# Patient Record
Sex: Male | Born: 1975 | Race: White | Hispanic: No | Marital: Single | Smoking: Former smoker
Health system: Southern US, Community
[De-identification: ages and names within clinical notes are randomized; demographics above are authoritative.]

## PROBLEM LIST (undated history)

## (undated) HISTORY — PX: ELBOW SURGERY: SHX618

## (undated) HISTORY — PX: BACK SURGERY: SHX140

---

## 2015-02-25 ENCOUNTER — Emergency Department
Admission: EM | Admit: 2015-02-25 | Discharge: 2015-02-25 | Disposition: A | Discharge status: Home / Self Care | Payer: BLUE CROSS/BLUE SHIELD | Attending: Emergency Medicine | Admitting: Emergency Medicine

## 2015-02-25 DIAGNOSIS — M5432 Sciatica, left side: Secondary | ICD-10-CM | POA: Diagnosis not present

## 2015-02-25 DIAGNOSIS — M62830 Muscle spasm of back: Secondary | ICD-10-CM | POA: Diagnosis not present

## 2015-02-25 DIAGNOSIS — M545 Low back pain: Secondary | ICD-10-CM | POA: Diagnosis present

## 2015-02-25 MED ORDER — MELOXICAM 15 MG PO TABS: 15.0000 mg | ORAL_TABLET | Freq: Every day | ORAL | Status: DC

## 2015-02-25 MED ORDER — DIAZEPAM 2 MG PO TABS: 2.0000 mg | ORAL_TABLET | Freq: Three times a day (TID) | ORAL | Status: AC | PRN

## 2015-02-25 NOTE — Discharge Instructions (Signed)
Back Exercises The following exercises strengthen the muscles that help to support the back. They also help to keep the lower back flexible. Doing these exercises can help to prevent back pain or lessen existing pain. If you have back pain or discomfort, try doing these exercises 2-3 times each day or as told by your health care provider. When the pain goes away, do them once each day, but increase the number of times that you repeat the steps for each exercise (do more repetitions). If you do not have back pain or discomfort, do these exercises once each day or as told by your health care provider. EXERCISES Single Knee to Chest Repeat these steps 3-5 times for each leg:  Lie on your back on a firm bed or the floor with your legs extended.  Bring one knee to your chest. Your other leg should stay extended and in contact with the floor.  Hold your knee in place by grabbing your knee or thigh.  Pull on your knee until you feel a gentle stretch in your lower back.  Hold the stretch for 10-30 seconds.  Slowly release and straighten your leg. Pelvic Tilt Repeat these steps 5-10 times:  Lie on your back on a firm bed or the floor with your legs extended.  Bend your knees so they are pointing toward the ceiling and your feet are flat on the floor.  Tighten your lower abdominal muscles to press your lower back against the floor. This motion will tilt your pelvis so your tailbone points up toward the ceiling instead of pointing to your feet or the floor.  With gentle tension and even breathing, hold this position for 5-10 seconds. Cat-Cow Repeat these steps until your lower back becomes more flexible:  Get into a hands-and-knees position on a firm surface. Keep your hands under your shoulders, and keep your knees under your hips. You may place padding under your knees for comfort.  Let your head hang down, and point your tailbone toward the floor so your lower back becomes rounded like the  back of a cat.  Hold this position for 5 seconds.  Slowly lift your head and point your tailbone up toward the ceiling so your back forms a sagging arch like the back of a cow.  Hold this position for 5 seconds. Press-Ups Repeat these steps 5-10 times:  Lie on your abdomen (face-down) on the floor.  Place your palms near your head, about shoulder-width apart.  While you keep your back as relaxed as possible and keep your hips on the floor, slowly straighten your arms to raise the top half of your body and lift your shoulders. Do not use your back muscles to raise your upper torso. You may adjust the placement of your hands to make yourself more comfortable.  Hold this position for 5 seconds while you keep your back relaxed.  Slowly return to lying flat on the floor. Bridges Repeat these steps 10 times:  Lie on your back on a firm surface.  Bend your knees so they are pointing toward the ceiling and your feet are flat on the floor.  Tighten your buttocks muscles and lift your buttocks off of the floor until your waist is at almost the same height as your knees. You should feel the muscles working in your buttocks and the back of your thighs. If you do not feel these muscles, slide your feet 1-2 inches farther away from your buttocks.  Hold this position for 3-5  seconds.  Slowly lower your hips to the starting position, and allow your buttocks muscles to relax completely. If this exercise is too easy, try doing it with your arms crossed over your chest. Abdominal Crunches Repeat these steps 5-10 times:  Lie on your back on a firm bed or the floor with your legs extended.  Bend your knees so they are pointing toward the ceiling and your feet are flat on the floor.  Cross your arms over your chest.  Tip your chin slightly toward your chest without bending your neck.  Tighten your abdominal muscles and slowly raise your trunk (torso) high enough to lift your shoulder blades a  tiny bit off of the floor. Avoid raising your torso higher than that, because it can put too much stress on your low back and it does not help to strengthen your abdominal muscles.  Slowly return to your starting position. Back Lifts Repeat these steps 5-10 times: 1. Lie on your abdomen (face-down) with your arms at your sides, and rest your forehead on the floor. 2. Tighten the muscles in your legs and your buttocks. 3. Slowly lift your chest off of the floor while you keep your hips pressed to the floor. Keep the back of your head in line with the curve in your back. Your eyes should be looking at the floor. 4. Hold this position for 3-5 seconds. 5. Slowly return to your starting position. SEEK MEDICAL CARE IF:  Your back pain or discomfort gets much worse when you do an exercise.  Your back pain or discomfort does not lessen within 2 hours after you exercise. If you have any of these problems, stop doing these exercises right away. Do not do them again unless your health care provider says that you can. SEEK IMMEDIATE MEDICAL CARE IF:  You develop sudden, severe back pain. If this happens, stop doing the exercises right away. Do not do them again unless your health care provider says that you can.   This information is not intended to replace advice given to you by your health care provider. Make sure you discuss any questions you have with your health care provider.   Document Released: 04/28/2004 Document Revised: 12/10/2014 Document Reviewed: 05/15/2014 Elsevier Interactive Patient Education 2016 [place] Injury Prevention Back injuries can be very painful. They can also be difficult to heal. After having one back injury, you are more likely to injure your back again. It is important to learn how to avoid injuring or re-injuring your back. The following tips can help you to prevent a back injury. WHAT SHOULD I KNOW ABOUT PHYSICAL FITNESS?  Exercise for 30 minutes per  day on most days of the week or as directed by your health care provider. Make sure to:  Do aerobic exercises, such as walking, jogging, biking, or swimming.  Do exercises that increase balance and strength, such as tai chi and yoga. These can decrease your risk of falling and injuring your back.  Do stretching exercises to help with flexibility.  Try to develop strong abdominal muscles. Your abdominal muscles provide a lot of the support that is needed by your back.  Maintain a healthy weight. This helps to decrease your risk of a back injury. WHAT SHOULD I KNOW ABOUT MY DIET?  Talk with your health care provider about your overall diet. Take supplements and vitamins only as directed by your health care provider.  Talk with your health care provider about how much calcium and  D you need each day. These nutrients help to prevent weakening of the bones (osteoporosis). Osteoporosis can cause broken (fractured) bones, which lead to back pain.  Include good sources of calcium in your diet, such as dairy products, green leafy vegetables, and products that have had calcium added to them (fortified).  Include good sources of vitamin D in your diet, such as milk and foods that are fortified with vitamin D. WHAT SHOULD I KNOW ABOUT MY POSTURE?  Sit up straight and stand up straight. Avoid leaning forward when you sit or hunching over when you stand.  Choose chairs that have good low-back (lumbar) support.  If you work at a desk, sit close to it so you do not need to lean over. Keep your chin tucked in. Keep your neck drawn back, and keep your elbows bent at a right angle. Your arms should look like the letter "L."  Sit high and close to the steering wheel when you drive. Add a lumbar support to your car seat, if needed.  Avoid sitting or standing in one position for very long. Take breaks to get up, stretch, and walk around at least one time every hour. Take breaks every hour if you are  driving for long periods of time.  Sleep on your side with your knees slightly bent, or sleep on your back with a pillow under your knees. Do not lie on the front of your body to sleep. WHAT SHOULD I KNOW ABOUT LIFTING, TWISTING, AND REACHING? Lifting and Heavy Lifting  Avoid heavy lifting, especially repetitive heavy lifting. If you must do heavy lifting:  Stretch before lifting.  Work slowly.  Rest between lifts.  Use a tool such as a cart or a dolly to move objects if one is available.  Make several small trips instead of carrying one heavy load.  Ask for help when you need it, especially when moving big objects.  Follow these steps when lifting:  Stand with your feet shoulder-width apart.  Get as close to the object as you can. Do not try to pick up a heavy object that is far from your body.  Use handles or lifting straps if they are available.  Bend at your knees. Squat down, but keep your heels off the floor.  Keep your shoulders pulled back, your chin tucked in, and your back straight.  Lift the object slowly while you tighten the muscles in your legs, abdomen, and buttocks. Keep the object as close to the center of your body as possible.  Follow these steps when putting down a heavy load:  Stand with your feet shoulder-width apart.  Lower the object slowly while you tighten the muscles in your legs, abdomen, and buttocks. Keep the object as close to the center of your body as possible.  Keep your shoulders pulled back, your chin tucked in, and your back straight.  Bend at your knees. Squat down, but keep your heels off the floor.  Use handles or lifting straps if they are available. Twisting and Reaching  Avoid lifting heavy objects above your waist.  Do not twist at your waist while you are lifting or carrying a load. If you need to turn, move your feet.  Do not bend over without bending at your knees.  Avoid reaching over your head, across a table, or  for an object on a high surface. WHAT ARE SOME OTHER TIPS?  Avoid wet floors and icy ground. Keep sidewalks clear of ice to prevent falls.    falls.  Do not sleep on a mattress that is too soft or too hard.  Keep items that are used frequently within easy reach.  Put heavier objects on shelves at waist level, and put lighter objects on lower or higher shelves.  Find ways to decrease your stress, such as exercise, massage, or relaxation techniques. Stress can build up in your muscles. Tense muscles are more vulnerable to injury.  Talk with your health care provider if you feel anxious or depressed. These conditions can make back pain worse.  Wear flat heel shoes with cushioned soles.  Avoid sudden movements.  Use both shoulder straps when carrying a backpack.  Do not use any tobacco products, including cigarettes, chewing tobacco, or electronic cigarettes. If you need help quitting, ask your health care provider.   This information is not intended to replace advice given to you by your health care provider. Make sure you discuss any questions you have with your health care provider.   Document Released: 04/28/2004 Document Revised: 08/05/2014 Document Reviewed: 03/25/2014 Elsevier Interactive Patient Education 2016 [place] therapy can help ease sore, stiff, injured, and tight muscles and joints. Heat relaxes your muscles, which may help ease your pain.  RISKS AND COMPLICATIONS If you have any of the following conditions, do not use heat therapy unless your health care provider has approved:  Poor circulation.  Healing wounds or scarred skin in the area being treated.  Diabetes, heart disease, or high blood pressure.  Not being able to feel (numbness) the area being treated.  Unusual swelling of the area being treated.  Active infections.  Blood clots.  Cancer.  Inability to communicate pain. This may include young children and people who have problems  with their brain function (dementia).  Pregnancy. Heat therapy should only be used on old, pre-existing, or long-lasting (chronic) injuries. Do not use heat therapy on new injuries unless directed by your health care provider. HOW TO USE HEAT THERAPY There are several different kinds of heat therapy, including:  Moist heat pack.  Warm water bath.  Hot water bottle.  Electric heating pad.  Heated gel pack.  Heated wrap.  Electric heating pad. Use the heat therapy method suggested by your health care provider. Follow your health care provider's instructions on when and how to use heat therapy. GENERAL HEAT THERAPY RECOMMENDATIONS  Do not sleep while using heat therapy. Only use heat therapy while you are awake.  Your skin may turn pink while using heat therapy. Do not use heat therapy if your skin turns red.  Do not use heat therapy if you have new pain.  High heat or long exposure to heat can cause burns. Be careful when using heat therapy to avoid burning your skin.  Do not use heat therapy on areas of your skin that are already irritated, such as with a rash or sunburn. SEEK MEDICAL CARE IF:  You have blisters, redness, swelling, or numbness.  You have new pain.  Your pain is worse. MAKE SURE YOU:  Understand these instructions.  Will watch your condition.  Will get help right away if you are not doing well or get worse.   This information is not intended to replace advice given to you by your health care provider. Make sure you discuss any questions you have with your health care provider.   Document Released: 06/13/2011 Document Revised: 04/11/2014 Document Reviewed: 05/14/2013 Elsevier Interactive Patient Education 2016 [employer]  Muscle Cramps and Spasms Muscle  cramps and spasms occur when a muscle or muscles tighten and you have no control over this tightening (involuntary muscle contraction). They are a common problem and can develop in any muscle. The  most common place is in the calf muscles of the leg. Both muscle cramps and muscle spasms are involuntary muscle contractions, but they also have differences:   Muscle cramps are sporadic and painful. They may last a few seconds to a quarter of an hour. Muscle cramps are often more forceful and last longer than muscle spasms.  Muscle spasms may or may not be painful. They may also last just a few seconds or much longer. CAUSES  It is uncommon for cramps or spasms to be due to a serious underlying problem. In many cases, the cause of cramps or spasms is unknown. Some common causes are:   Overexertion.   Overuse from repetitive motions (doing the same thing over and over).   Remaining in a certain position for a long period of time.   Improper preparation, form, or technique while performing a sport or activity.   Dehydration.   Injury.   Side effects of some medicines.   Abnormally low levels of the salts and ions in your blood (electrolytes), especially potassium and calcium. This could happen if you are taking water pills (diuretics) or you are pregnant.  Some underlying medical problems can make it more likely to develop cramps or spasms. These include, but are not limited to:   Diabetes.   Parkinson disease.   Hormone disorders, such as thyroid problems.   Alcohol abuse.   Diseases specific to muscles, joints, and bones.   Blood vessel disease where not enough blood is getting to the muscles.  HOME CARE INSTRUCTIONS   Stay well hydrated. Drink enough water and fluids to keep your urine clear or pale yellow.  It may be helpful to massage, stretch, and relax the affected muscle.  For tight or tense muscles, use a warm towel, heating pad, or hot shower water directed to the affected area.  If you are sore or have pain after a cramp or spasm, applying ice to the affected area may relieve discomfort.  Put ice in a plastic bag.  Place a towel between your  skin and the bag.  Leave the ice on for 15-20 minutes, 03-04 times a day.  Medicines used to treat a known cause of cramps or spasms may help reduce their frequency or severity. Only take over-the-counter or prescription medicines as directed by your caregiver. SEEK MEDICAL CARE IF:  Your cramps or spasms get more severe, more frequent, or do not improve over time.  MAKE SURE YOU:   Understand these instructions.  Will watch your condition.  Will get help right away if you are not doing well or get worse.   This information is not intended to replace advice given to you by your health care provider. Make sure you discuss any questions you have with your health care provider.   Document Released: 09/10/2001 Document Revised: 07/16/2012 Document Reviewed: 03/07/2012 Elsevier Interactive Patient Education [employer].

## 2015-02-25 NOTE — ED Provider Notes (Signed)
[facility] Emergency Department Provider Note  ____________________________________________  Time seen: Approximately 4:49 PM  I have reviewed the triage vital signs and the nursing notes.   HISTORY  Chief Complaint Back Pain    HPI Francisco Herrera is a 39 y.o. male who presents to emergency department complaining of left lower back pain 2 weeks. He states that he has a history of back surgery with bulging disks. He states the surgery was 8 years ago and has had intermittent pain but generally on the right side. He still denies any specific injury leading up to this. Symptoms began gradually with some "aching" have increased to sharp pain that radiates down his left leg. He states that when this is happening in the right side he has been prescribed Valium due to extreme sedation from muscle relaxers. Patient endorses mild numbness and tingling to the lateral aspect of left leg. Symptoms are moderate to severe, described as a numb/tingling sensation.   No past medical history on file.  There are no active problems to display for this patient.   No past surgical history on file.  Current Outpatient Rx  Name  Route  Sig  Dispense  Refill  . diazepam (VALIUM) 2 MG tablet   Oral   Take 1 tablet (2 mg total) by mouth every 8 (eight) hours as needed for muscle spasms.   12 tablet   0   . meloxicam (MOBIC) 15 MG tablet   Oral   Take 1 tablet (15 mg total) by mouth daily.   30 tablet   0     Allergies Review of patient's allergies indicates not on file.  No family history on file.  Social History Social History  Substance Use Topics  . Smoking status: Not on file  . Smokeless tobacco: Not on file  . Alcohol Use: Not on file    Review of Systems Constitutional: No fever/chills Eyes: No visual changes. ENT: No sore throat. Cardiovascular: Denies chest pain. Respiratory: Denies shortness of breath. Gastrointestinal: No abdominal pain.  No  nausea, no vomiting.  No diarrhea.  No constipation. Genitourinary: Negative for dysuria. Musculoskeletal: Endorses left-sided lower back pain. Skin: Negative for rash. Neurological: Negative for headaches, focal weakness or numbness.  10-point ROS otherwise negative.  ____________________________________________   PHYSICAL EXAM:  VITAL SIGNS: ED Triage Vitals  Enc Vitals Group     BP 02/25/15 1634 134/83 mmHg     Pulse Rate 02/25/15 1634 66     Resp 02/25/15 1634 20     Temp 02/25/15 1634 98.6 F (37 C)     Temp Source 02/25/15 1634 Oral     SpO2 02/25/15 1634 98 %     Weight 02/25/15 1634 202 lb (91.627 kg)     Height 02/25/15 1634  (1.854 m)     Head Cir --      Peak Flow --      Pain Score 02/25/15 1641 8     Pain Loc --      Pain Edu? --      Excl. in GC? --     Constitutional: Alert and oriented. Well appearing and in no acute distress. Eyes: Conjunctivae are normal. PERRL. EOMI. Head: Atraumatic. Nose: No congestion/rhinnorhea. Mouth/Throat: Mucous membranes are moist.  Oropharynx non-erythematous. Neck: No stridor.   Cardiovascular: Normal rate, regular rhythm. Grossly normal heart sounds.  Good peripheral circulation. Respiratory: Normal respiratory effort.  No retractions. Lungs CTAB. Gastrointestinal: Soft and nontender. No distention. No abdominal  bruits. No CVA tenderness. Musculoskeletal: No lower extremity tenderness nor edema.  No joint effusions. Scar is visualized over L3-L4. No other visible abnormality. No deformity. Patient is diffusely tender to palpation over the left paraspinal muscles. Patient is nontender to palpation of spinal processes in the lumbar region. Positive straight leg raise left side. Neurologic:  Normal speech and language. No gross focal neurologic deficits are appreciated. No gait instability. Skin:  Skin is warm, dry and intact. No rash noted. Psychiatric: Mood and affect are normal. Speech and behavior are  normal.  ____________________________________________   LABS (all labs ordered are listed, but only abnormal results are displayed)  Labs Reviewed - No data to display ____________________________________________  EKG   ____________________________________________  RADIOLOGY   ____________________________________________   PROCEDURES  Procedure(s) performed: None  Critical Care performed: No  ____________________________________________   INITIAL IMPRESSION / ASSESSMENT AND PLAN / ED COURSE  Pertinent labs & imaging results that were available during my care of the patient were reviewed by me and considered in my medical decision making (see chart for details).  The patient's history, symptoms, physical exam are taken and consideration of diagnosis. The patient has muscular spasms in the left lumbar paraspinal muscle group as well as sciatica. I advised patient of findings and diagnosis and he verbalizes understanding of same. The patient will be placed on Valium and moment for symptom control. Patient verbalizes understanding the treatment plan and verbalizes compliance with same. ____________________________________________   FINAL CLINICAL IMPRESSION(S) / ED DIAGNOSES  Final diagnoses:  Lumbar paraspinal muscle spasm  Sciatica of left side      Racheal PatchesJonathan D Cuthriell, PA-C 02/25/15 1716  Sharyn CreamerMark Quale, MD 02/26/15 2352

## 2015-02-25 NOTE — ED Notes (Signed)
Pt presents with low back pain for two weeks. Pt states two days ago the pains began to shoot into his left buttock and left leg. Pt states left foot is numb.

## 2015-02-25 NOTE — ED Notes (Signed)
Pt c/o left lower back pain that radiates into the left leg for the past 7-10 days..states he does a lot of heavy lifting at work.

## 2015-03-03 ENCOUNTER — Encounter: Payer: Self-pay | Admitting: *Deleted

## 2015-03-03 ENCOUNTER — Ambulatory Visit
Admission: EM | Admit: 2015-03-03 | Discharge: 2015-03-03 | Disposition: A | Discharge status: Home / Self Care | Payer: BLUE CROSS/BLUE SHIELD | Attending: Family Medicine | Admitting: Family Medicine

## 2015-03-03 DIAGNOSIS — M5410 Radiculopathy, site unspecified: Secondary | ICD-10-CM | POA: Diagnosis not present

## 2015-03-03 DIAGNOSIS — R202 Paresthesia of skin: Secondary | ICD-10-CM

## 2015-03-03 DIAGNOSIS — M5432 Sciatica, left side: Secondary | ICD-10-CM | POA: Diagnosis not present

## 2015-03-03 MED ORDER — KETOROLAC TROMETHAMINE 60 MG/2ML IM SOLN
60.0000 mg | Freq: Once | INTRAMUSCULAR | Status: AC
  Administered 2015-03-03: 60 mg via INTRAMUSCULAR

## 2015-03-03 MED ORDER — METHYLPREDNISOLONE SODIUM SUCC 125 MG IJ SOLR
125.0000 mg | Freq: Once | INTRAMUSCULAR | Status: AC
  Administered 2015-03-03: 125 mg via INTRAMUSCULAR

## 2015-03-03 MED ORDER — ORPHENADRINE CITRATE ER 100 MG PO TB12: 100.0000 mg | ORAL_TABLET | Freq: Two times a day (BID) | ORAL | Status: DC

## 2015-03-03 NOTE — ED Provider Notes (Signed)
CSN: [id number]     Arrival date & time 03/03/15  1727 History   First MD Initiated Contact with Patient 03/03/15 1947     Chief Complaint  Patient presents with  . Sciatica   (Consider location/radiation/quality/duration/timing/severity/associated sxs/prior Treatment) HPI 39 yo M presents with acute left low back pain. Problem has been waxing and waning over past 3-4 weeks  He had increasing pain and was seen a week ago in [facility] ER- treated with Valium and meloxicam. Initially somewhat relieved but now   steadily more severe. Left thigh and lower leg with paresthesia- patient having difficulty voiding except when seated- has not had issues with BM to this point.  Feels mild numbness tingling perineum intermittent. Ambulatory.Has been trying to keep working-now notes ocassional issue with left foot drop [employer] with heavy tires, engines, batteries. Unable to sleep past few nights- growing frazzled, pain worse.   Previous back surgery after disc issues and diffciulty with right sided symptoms- has doen very well for interim years by his report. Initial surgery in [facility]. Currently scheduled to see Dr Hyacinth Meeker with [facility] in next week. Marland Kitchen History reviewed. No pertinent past medical history. Past Surgical History  Procedure Laterality Date  . Back surgery    . Elbow surgery     No family history on file. Social History  Substance Use Topics  . Smoking status: Former Games developer  . Smokeless tobacco: None  . Alcohol Use: No    Review of Systems Constitutional: No fever.  Eyes: No visual changes. ENT:No sore throat. Cardiovascular:Negative for chest pain/palpitations Respiratory: Negative for shortness of breath Gastrointestinal: No abdominal pain. No nausea,vomiting, diarrhea Genitourinary: Negative for dysuria. Normal urination. Musculoskeletal: Negative for back pain. FROM extremities without pain Skin: Negative for rash Neurological: Negative for headache, focal  weakness or numbness   Allergies  Review of patient's allergies indicates no known allergies.  Home Medications   Prior to Admission medications   Medication Sig Start Date End Date Taking? Authorizing Provider  diazepam (VALIUM) 2 MG tablet Take 1 tablet (2 mg total) by mouth every 8 (eight) hours as needed for muscle spasms. 02/25/15 02/25/16  Christiane Ha D Cuthriell, PA-C  meloxicam (MOBIC) 15 MG tablet Take 1 tablet (15 mg total) by mouth daily. 02/25/15   Delorise Royals Cuthriell, PA-C  orphenadrine (NORFLEX) 100 MG tablet Take 1 tablet (100 mg total) by mouth 2 (two) times daily. 03/03/15   Rae Halsted, PA-C   Meds Ordered and Administered this Visit   Medications  ketorolac (TORADOL) injection 60 mg (60 mg Intramuscular Given 03/03/15 1958)  methylPREDNISolone sodium succinate (SOLU-MEDROL) 125 mg/2 mL injection 125 mg (125 mg Intramuscular Given 03/03/15 2028)  much improved though not entirely relieve with ice packs plus Rx; able to rise from chair with less difficulty, ambulatory    BP 122/98 mmHg  Pulse 76  Temp(Src) 98 F (36.7 C) (Oral)  Ht  (1.854 m)  Wt 205 lb (92.987 kg)  BMI 27.05 kg/m2  SpO2 99% No data found.   Physical Exam   Constitutional -alert and oriented, in moderate distress, left low back pain; careful historian General: Mod  acute distress- 2-3 weeks increasing difficulty with left sciatic and radiculopathy Head-atraumatic, normocephalic Eyes- conjunctiva normal, EOMI ,conjugate gaze Ears: grossly normal hearing Nose- no congestion or rhinorrhea Mouth/throat- mucous membranes moist ,oropharynx non-erythematous Neck- supple without glandular enlargement CV- regular rate and rhythmn Resp-no distress, normal respiratory effort,clear to auscultation bilaterally Back - no CVAT ; no para-spinous  spasm or pain; pain is point specific left sacroiliac area. Radiatio through buttocks to posterior left thigh. To posterior knee. With paresthesia lower  leg/calf- outer dermatome reflects decreased sense of touch "as if being examine through pant leg fabric"; medial calf with hypersensitivity - pins ans needles Reaction. Good pulses, feet warm with good pulses, good cap fill.   Well leg positive SLR to affected left sacro-illiac region.  GI- soft,non-tender,no distention GU- not examined. patient desrcibes needing to sit to void; saddle numbness or tingling developing MSK- ambulatory , antalgic, holding left low back in obvious distress- prefers backed chair to exam table. Raises and lowers himself but with pain.  Neuro- normal speech and language, no gross focal neurological deficit appreciated,  Skin-warm,dry ,intact;  Psych-mood and affect grossly normal; speech and behavior grossly normal  ED Course  Procedures (including critical care time)  Labs Review Labs Reviewed - No data to display  Imaging Review No results found.     MDM   1. Sciatica of left side   2. Paresthesia    Plan: Diagnosis reviewed with patient He has  ONE valium tablet left from previous Rx and will use to sleep tonight as pharmacies are closed now  Ditto for first morning Meloxicam Rx Add Norflex starting tomorrow Have carefully reviewed his presentation and possible implications of changing pain patterns and paresthesia. He will call Dr Hyacinth MeekerMiller office in AM and present this evening findings to triage nurse- Request  being seen more quickly than one week from now as currently scheduled  Rec return to ER if symptoms increase in interim- we are available and willing to see him but do not  Have diagnostics available nor Ortho/Neuro team for consultation  Questions fielded, expectations and recommendations reviewed. Discussed follow up and return parameters including no resolution or any worsening condition. . Patient expresses understanding  and agrees to plan. [facility] questions, concerns or exacerbation.   Discharge Medication List as of  03/03/2015  8:47 PM    Norflex  Rae HalstedLaurie W Belen Zwahlen, PA-C 03/04/15 1324

## 2015-03-03 NOTE — Discharge Instructions (Signed)
Call Dr Hyacinth MeekerMiller office in the morning and report Numbness and tingling in your leg, the need to sit to empty your bladder, and new onset of left foot .  SPEAK TO TRIAGE NURSE   Out of work tomorrow for additional care  Low back- sciatica with radiculopathy increasing        Sciatica Sciatica is pain, weakness, numbness, or tingling along the path of the sciatic nerve. The nerve starts in the lower back and runs down the back of each leg. The nerve controls the muscles in the lower leg and in the back of the knee, while also providing sensation to the back of the thigh, lower leg, and the sole of your foot. Sciatica is a symptom of another medical condition. For instance, nerve damage or certain conditions, such as a herniated disk or bone spur on the spine, pinch or put pressure on the sciatic nerve. This causes the pain, weakness, or other sensations normally associated with sciatica. Generally, sciatica only affects one side of the body. CAUSES   Herniated or slipped disc.  Degenerative disk disease.  A pain disorder involving the narrow muscle in the buttocks (piriformis syndrome).  Pelvic injury or fracture.  Pregnancy.  Tumor (rare). SYMPTOMS  Symptoms can vary from mild to very severe. The symptoms usually travel from the low back to the buttocks and down the back of the leg. Symptoms can include:  Mild tingling or dull aches in the lower back, leg, or hip.  Numbness in the back of the calf or sole of the foot.  Burning sensations in the lower back, leg, or hip.  Sharp pains in the lower back, leg, or hip.  Leg weakness.  Severe back pain inhibiting movement. These symptoms may get worse with coughing, sneezing, laughing, or prolonged sitting or standing. Also, being overweight may worsen symptoms. DIAGNOSIS  Your caregiver will perform a physical exam to look for common symptoms of sciatica. He or she may ask you to do certain movements or activities that would  trigger sciatic nerve pain. Other tests may be performed to find the cause of the sciatica. These may include:  Blood tests.  X-rays.  Imaging tests, such as an MRI or CT scan. TREATMENT  Treatment is directed at the cause of the sciatic pain. Sometimes, treatment is not necessary and the pain and discomfort goes away on its own. If treatment is needed, your caregiver may suggest:  Over-the-counter medicines to relieve pain.  Prescription medicines, such as anti-inflammatory medicine, muscle relaxants, or narcotics.  Applying heat or ice to the painful area.  Steroid injections to lessen pain, irritation, and inflammation around the nerve.  Reducing activity during periods of pain.  Exercising and stretching to strengthen your abdomen and improve flexibility of your spine. Your caregiver may suggest losing weight if the extra weight makes the back pain worse.  Physical therapy.  Surgery to eliminate what is pressing or pinching the nerve, such as a bone spur or part of a herniated disk. HOME CARE INSTRUCTIONS   Only take over-the-counter or prescription medicines for pain or discomfort as directed by your caregiver.  Apply ice to the affected area for 20 minutes, 3-4 times a day for the first 48-72 hours. Then try heat in the same way.  Exercise, stretch, or perform your usual activities if these do not aggravate your pain.  Attend physical therapy sessions as directed by your caregiver.  Keep all follow-up appointments as directed by your caregiver.  Do not  wear high heels or shoes that do not provide proper support.  Check your mattress to see if it is too soft. A firm mattress may lessen your pain and discomfort. SEEK IMMEDIATE MEDICAL CARE IF:   You lose control of your bowel or bladder (incontinence).  You have increasing weakness in the lower back, pelvis, buttocks, or legs.  You have redness or swelling of your back.  You have a burning sensation when you  urinate.  You have pain that gets worse when you lie down or awakens you at night.  Your pain is worse than you have experienced in the past.  Your pain is lasting longer than 4 weeks.  You are suddenly losing weight without reason. MAKE SURE YOU:  Understand these instructions.  Will watch your condition.  Will get help right away if you are not doing well or get worse.   This information is not intended to replace advice given to you by your health care provider. Make sure you discuss any questions you have with your health care provider.   Document Released: 03/15/2001 Document Revised: 12/10/2014 Document Reviewed: 07/31/2011 Elsevier Interactive Patient Education 2016 Elsevier Inc. Radicular Pain Radicular pain in either the arm or leg is usually from a bulging or herniated disk in the spine. A piece of the herniated disk may press against the nerves as the nerves exit the spine. This causes pain which is felt at the tips of the nerves down the arm or leg. Other causes of radicular pain may include:  Fractures.  Heart disease.  Cancer.  An abnormal and usually degenerative state of the nervous system or nerves (neuropathy). Diagnosis may require CT or MRI scanning to determine the primary cause.  Nerves that start at the neck (nerve roots) may cause radicular pain in the outer shoulder and arm. It can spread down to the thumb and fingers. The symptoms vary depending on which nerve root has been affected. In most cases radicular pain improves with conservative treatment. Neck problems may require physical therapy, a neck collar, or cervical traction. Treatment may take many weeks, and surgery may be considered if the symptoms do not improve.  Conservative treatment is also recommended for sciatica. Sciatica causes pain to radiate from the lower back or buttock area down the leg into the foot. Often there is a history of back problems. Most patients with sciatica are better after  2 to 4 weeks of rest and other supportive care. Short term bed rest can reduce the disk pressure considerably. Sitting, however, is not a good position since this increases the pressure on the disk. You should avoid bending, lifting, and all other activities which make the problem worse. Traction can be used in severe cases. Surgery is usually reserved for patients who do not improve within the first months of treatment. Only take over-the-counter or prescription medicines for pain, discomfort, or fever as directed by your caregiver. Narcotics and muscle relaxants may help by relieving more severe pain and spasm and by providing mild sedation. Cold or massage can give significant relief. Spinal manipulation is not recommended. It can increase the degree of disc protrusion. Epidural steroid injections are often effective treatment for radicular pain. These injections deliver medicine to the spinal nerve in the space between the protective covering of the spinal cord and back bones (vertebrae). Your caregiver can give you more information about steroid injections. These injections are most effective when given within two weeks of the onset of pain.  You should  see your caregiver for follow up care as recommended. A program for neck and back injury rehabilitation with stretching and strengthening exercises is an important part of management.  SEEK IMMEDIATE MEDICAL CARE IF:  You develop increased pain, weakness, or numbness in your arm or leg.  You develop difficulty with bladder or bowel control.  You develop abdominal pain.   This information is not intended to replace advice given to you by your health care provider. Make sure you discuss any questions you have with your health care provider.   Document Released: 04/28/2004 Document Revised: 04/11/2014 Document Reviewed: 10/15/2014 Elsevier Interactive Patient Education [employer]

## 2015-03-03 NOTE — ED Notes (Signed)
Pt states that he was seen at the [facility] ED 2 weeks ago, dx with sciatica.  Pt is having lower back pain, left leg pain.  Pt has been using heat, that does give some relief, and taking meloxicam that was given by MD at ED.  Has a scheduled [facility] next week.

## 2015-03-04 ENCOUNTER — Encounter: Payer: Self-pay | Admitting: Physician Assistant

## 2015-03-04 ENCOUNTER — Telehealth: Payer: Self-pay | Admitting: Emergency Medicine

## 2015-06-15 ENCOUNTER — Other Ambulatory Visit: Payer: Self-pay | Admitting: Physical Medicine and Rehabilitation

## 2015-06-15 DIAGNOSIS — M5416 Radiculopathy, lumbar region: Secondary | ICD-10-CM

## 2015-07-02 ENCOUNTER — Ambulatory Visit
Admission: RE | Admit: 2015-07-02 | Discharge: 2015-07-02 | Disposition: A | Discharge status: Home / Self Care | Payer: BLUE CROSS/BLUE SHIELD | Source: Ambulatory Visit | Attending: Physical Medicine and Rehabilitation | Admitting: Physical Medicine and Rehabilitation

## 2015-07-02 DIAGNOSIS — M5126 Other intervertebral disc displacement, lumbar region: Secondary | ICD-10-CM | POA: Insufficient documentation

## 2015-07-02 DIAGNOSIS — M5416 Radiculopathy, lumbar region: Secondary | ICD-10-CM | POA: Insufficient documentation

## 2015-07-02 DIAGNOSIS — M4806 Spinal stenosis, lumbar region: Secondary | ICD-10-CM | POA: Insufficient documentation

## 2017-07-04 ENCOUNTER — Encounter: Payer: Self-pay | Admitting: Family Medicine

## 2017-07-04 ENCOUNTER — Ambulatory Visit (INDEPENDENT_AMBULATORY_CARE_PROVIDER_SITE_OTHER): Payer: Worker's Compensation

## 2017-07-04 ENCOUNTER — Ambulatory Visit (INDEPENDENT_AMBULATORY_CARE_PROVIDER_SITE_OTHER): Payer: Worker's Compensation | Admitting: Family Medicine

## 2017-07-04 VITALS — BP 131/81 | HR 71 | Temp 98.4°F | Ht 73.0 in | Wt 225.6 lb

## 2017-07-04 DIAGNOSIS — S59902A Unspecified injury of left elbow, initial encounter: Secondary | ICD-10-CM

## 2017-07-04 DIAGNOSIS — S6992XA Unspecified injury of left wrist, hand and finger(s), initial encounter: Secondary | ICD-10-CM | POA: Diagnosis not present

## 2017-07-04 DIAGNOSIS — S0081XA Abrasion of other part of head, initial encounter: Secondary | ICD-10-CM | POA: Diagnosis not present

## 2017-07-04 DIAGNOSIS — S99922A Unspecified injury of left foot, initial encounter: Secondary | ICD-10-CM

## 2017-07-04 NOTE — Patient Instructions (Signed)
Great to meet you! 

## 2017-07-04 NOTE — Progress Notes (Signed)
   HPI  Patient presents today here after an injury at work.  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  He did not realize his foot was lodged under the pipe and as he stepped forward he fell twisting around landing on his left elbow and hitting his forehead.  Patient did not lose consciousness.  He has what he feels is understandable pain of the left elbow and left pinky where he has abrasions.  He also has left foot pain over the first and second metatarsals.  Patient states that he has returned back to work like normal and does not feel he needs any limitations to his work.  He had knee surgery about a year ago and has had slight swelling of the left knee however he does not feel that it is injured in this accident.  PMH: Smoking status noted ROS: Per HPI  Objective: BP 131/81   Pulse 71   Temp 98.4 F (36.9 C) (Oral)   Ht 6\' 1"  (1.854 m)   Wt 225 lb 9.6 oz (102.3 kg)   BMI 29.76 kg/m  Gen: NAD, alert, cooperative with exam HEENT: NCAT CV: RRR, good S1/S2, no murmur Resp: CTABL, no wheezes, non-labored Ext: No edema, warm Neuro: Alert and oriented, No gross deficits  MSK:  Tender to palpation over the first and second metatarsals of the left foot, no joint laxity of the ankle, erythema overlying the tender area  Skin See below: Left fifth digit with approximately 1 cm x 1/2 cm laceration that is superficial Left elbow with approximately 1-1/2 cm roughly circular abrasion that is superficial Forehead with shallow abrasion on the nasal bridge and in 4 different areas of the midline and right-sided forehead.          Assessment and plan:  #Injury of left foot, left fifth finger injury, left elbow injury Abrasions on the left fifth digit, left elbow, and forehead pictured above No  concerns for bony injury of the left elbow left hand, or skull These all resulted from his fall at work. Patient is doing very well and does not feel that he needs any limited duties. Plain film to rule out bony abnormality of the left foot Follow-up as needed, supportive care only   Orders Placed This Encounter  Procedures  . DG Foot Complete Left    Standing Status:   Future    Number of Occurrences:   1    Standing Expiration Date:   09/04/2018    Order Specific Question:   Reason for Exam (SYMPTOM  OR DIAGNOSIS REQUIRED)    Answer:   Pain over 1st and 2nd Metatarsals    Order Specific Question:   Preferred imaging location?    Answer:   Internal    Order Specific Question:   Radiology Contrast Protocol - do NOT remove file path    Answer:   [email or web address]     Murtis SinkSam Bradshaw, MD [facility] 07/04/2017, 2:06 PM

## 2017-07-10 ENCOUNTER — Encounter: Payer: Self-pay | Admitting: Nurse Practitioner

## 2017-07-10 ENCOUNTER — Ambulatory Visit (INDEPENDENT_AMBULATORY_CARE_PROVIDER_SITE_OTHER): Payer: Worker's Compensation | Admitting: Nurse Practitioner

## 2017-07-10 VITALS — BP 128/71 | HR 73 | Temp 97.8°F | Ht 73.0 in | Wt 226.0 lb

## 2017-07-10 DIAGNOSIS — M71022 Abscess of bursa, left elbow: Secondary | ICD-10-CM

## 2017-07-10 MED ORDER — SULFAMETHOXAZOLE-TRIMETHOPRIM 800-160 MG PO TABS: 1.0000 | ORAL_TABLET | Freq: Two times a day (BID) | ORAL | 0 refills | Status: AC

## 2017-07-10 NOTE — Progress Notes (Signed)
   Subjective:    Patient ID: Francisco Herrera, male    DOB: 12/03/1975, 42 y.o.   MRN: [id number]  HPI Francisco KenningJustin Deremer comes in today for workers comp injury.  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 Over weekend elbow has gotten sore with yellowish drainage.     Review of Systems  Constitutional: Negative.   Respiratory: Negative.   Cardiovascular: Negative.   Genitourinary: Negative.   Neurological: Negative.   Psychiatric/Behavioral: Negative.   All other systems reviewed and are negative.      Objective:   Physical Exam  Constitutional: He is oriented to person, place, and time. He appears well-developed and well-nourished. No distress.  Cardiovascular: Normal rate and regular rhythm.  Pulmonary/Chest: Effort normal and breath sounds normal.  Musculoskeletal:  FROM of left elbow without pain  Neurological: He is alert and oriented to person, place, and time.  Skin: Skin is warm.  3cm superficial laceration of left elbow- fluid is yellowish like synovial fluid  Psychiatric: He has a normal mood and affect. His behavior is normal. Judgment and thought content normal.   BP 128/71   Pulse 73   Temp 97.8 F (36.6 C) (Oral)   Ht 6\' 1"  (1.854 m)   Wt 226 lb (102.5 kg)   BMI 29.82 kg/m         Assessment & Plan:   1. Abscess of bursa of left elbow   culture obtained Clean with antibacterial soap BID pressure dressing If no better by Thursday call and will do ortho referral No wprk restrictions Meds ordered this encounter  Medications  . sulfamethoxazole-trimethoprim (BACTRIM DS) 800-160 MG tablet    Sig: Take 1 tablet by mouth 2 (two) times daily.    Dispense:  20 tablet    Refill:  0    Order Specific Question:   Supervising Provider    Answer:   Johna SheriffVINCENT, CAROL L [[id number]]   Mary-Margaret Daphine DeutscherMartin, FNP

## 2017-07-14 LAB — ANAEROBIC AND AEROBIC CULTURE

## 2019-03-26 IMAGING — DX DG FOOT COMPLETE 3+V*L*
3 series · 3 of 3 positions shown · non-contrast
Comparison: None in PACs

CLINICAL DATA: Tripping injury yesterday.  Dorsal foot pain.

EXAM:
LEFT FOOT - COMPLETE 3+ VIEW

[foot ap]
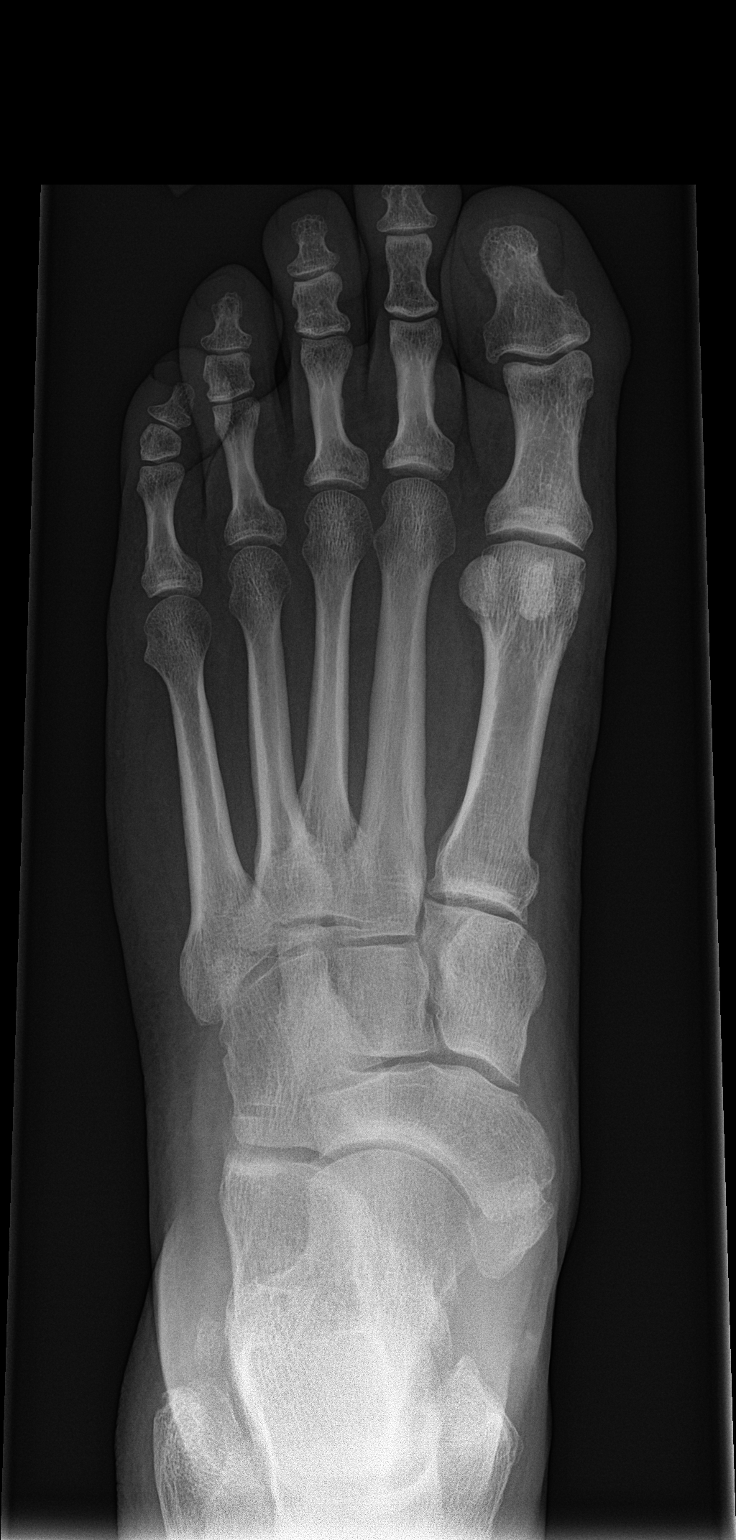

[foot obl]
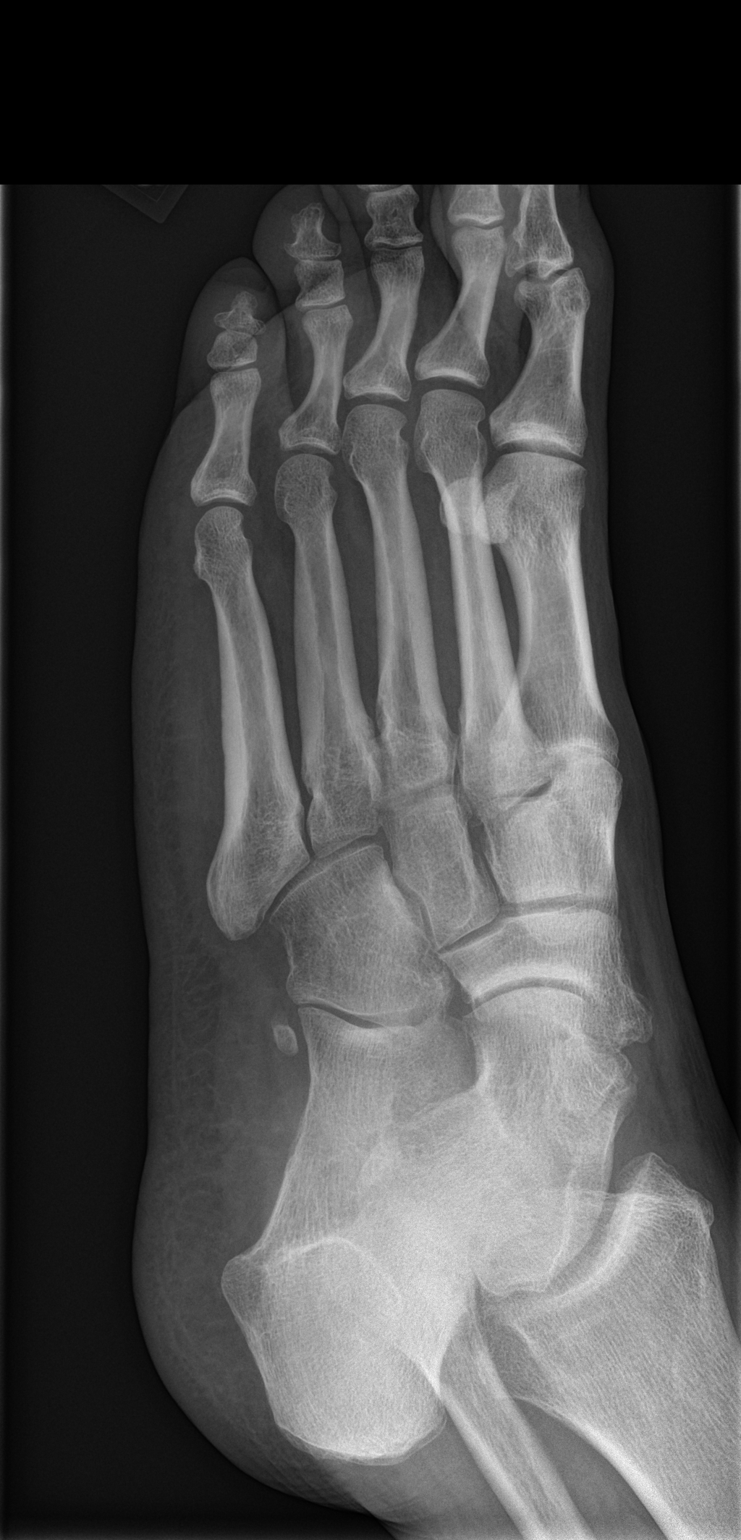

[foot lat]
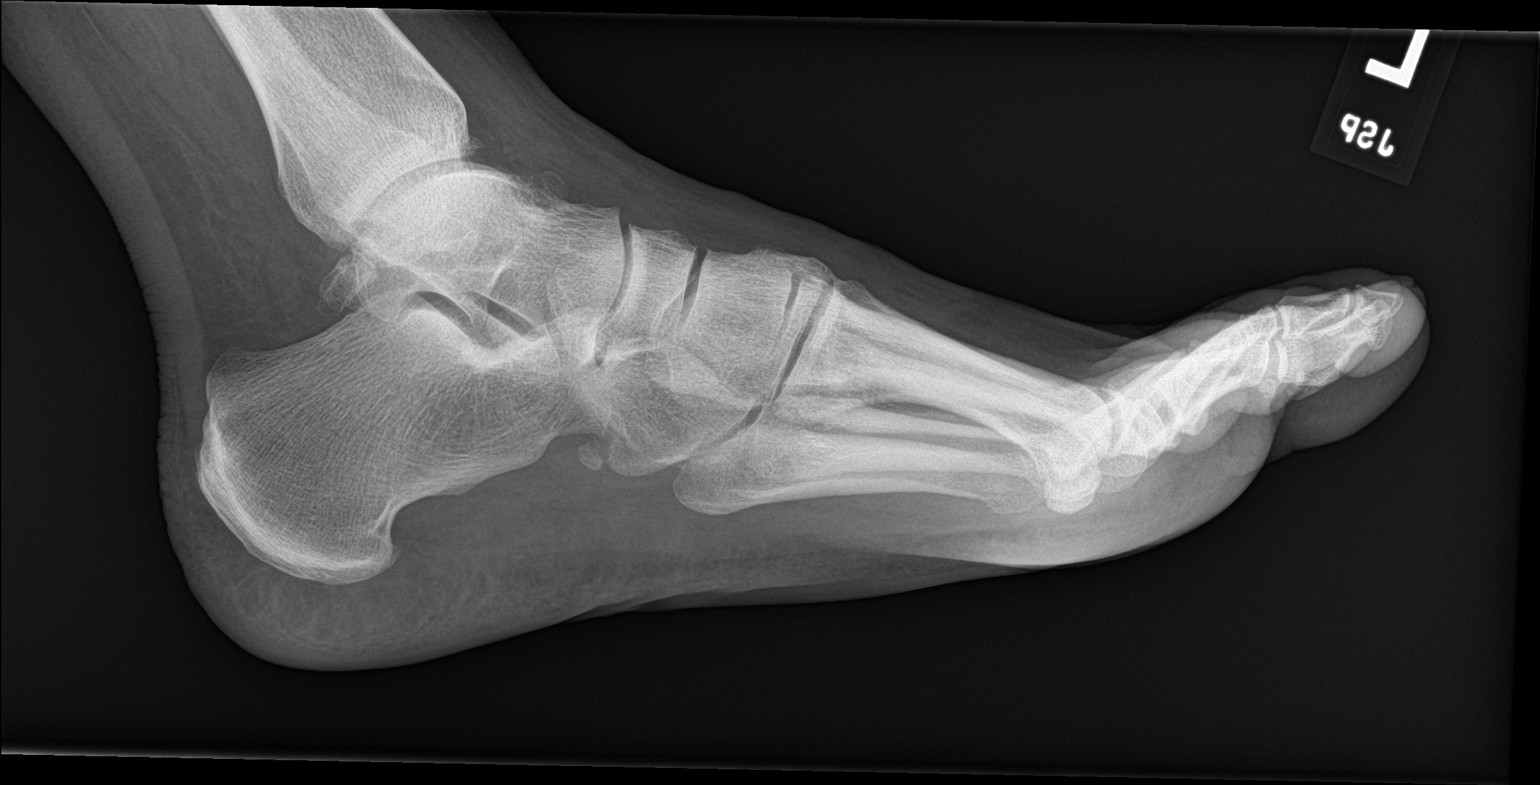

[3 of 3 positions shown; findings below may reference images not displayed]

FINDINGS: The bones are subjectively adequately mineralized. There is no acute
fracture nor dislocation. The joint spaces are well maintained.
There is mild soft tissue swelling over the dorsum of the midfoot.
IMPRESSION: There is no acute fracture nor dislocation of the bones of the left
foot.
